# Patient Record
Sex: Female | Born: 1955 | Race: White | Hispanic: No | Smoking: Former smoker
Health system: Southern US, Community
[De-identification: ages and names within clinical notes are randomized; demographics above are authoritative.]

## PROBLEM LIST (undated history)

## (undated) DIAGNOSIS — E785 Hyperlipidemia, unspecified: Secondary | ICD-10-CM

## (undated) DIAGNOSIS — I4891 Unspecified atrial fibrillation: Secondary | ICD-10-CM

## (undated) DIAGNOSIS — M8580 Other specified disorders of bone density and structure, unspecified site: Secondary | ICD-10-CM

## (undated) DIAGNOSIS — K219 Gastro-esophageal reflux disease without esophagitis: Secondary | ICD-10-CM

## (undated) HISTORY — DX: Gastro-esophageal reflux disease without esophagitis: K21.9

## (undated) HISTORY — DX: Hyperlipidemia, unspecified: E78.5

## (undated) HISTORY — DX: Other specified disorders of bone density and structure, unspecified site: M85.80

## (undated) HISTORY — PX: EXPLORATORY LAPAROTOMY: SUR591

## (undated) HISTORY — PX: TONSILLECTOMY AND ADENOIDECTOMY: SUR1326

---

## 2007-03-13 ENCOUNTER — Other Ambulatory Visit: Admission: RE | Admit: 2007-03-13 | Discharge: 2007-03-13 | Payer: Self-pay | Admitting: Family Medicine

## 2007-03-21 ENCOUNTER — Encounter: Admission: RE | Admit: 2007-03-21 | Discharge: 2007-03-21 | Payer: Self-pay | Admitting: Family Medicine

## 2007-11-14 ENCOUNTER — Encounter: Admission: RE | Admit: 2007-11-14 | Discharge: 2007-11-14 | Payer: Self-pay | Admitting: Family Medicine

## 2009-07-18 ENCOUNTER — Other Ambulatory Visit: Admission: RE | Admit: 2009-07-18 | Discharge: 2009-07-18 | Payer: Self-pay | Admitting: Family Medicine

## 2010-07-26 ENCOUNTER — Other Ambulatory Visit: Payer: Self-pay | Admitting: Family Medicine

## 2010-07-26 ENCOUNTER — Other Ambulatory Visit (HOSPITAL_COMMUNITY)
Admission: RE | Admit: 2010-07-26 | Discharge: 2010-07-26 | Disposition: A | Discharge status: Home / Self Care | Payer: Self-pay | Source: Ambulatory Visit | Attending: Family Medicine | Admitting: Family Medicine

## 2010-07-26 DIAGNOSIS — Z124 Encounter for screening for malignant neoplasm of cervix: Secondary | ICD-10-CM | POA: Insufficient documentation

## 2011-08-07 ENCOUNTER — Encounter (HOSPITAL_BASED_OUTPATIENT_CLINIC_OR_DEPARTMENT_OTHER): Admission: RE | Payer: Self-pay | Source: Ambulatory Visit

## 2011-08-07 ENCOUNTER — Ambulatory Visit (HOSPITAL_BASED_OUTPATIENT_CLINIC_OR_DEPARTMENT_OTHER)
Admission: RE | Admit: 2011-08-07 | Payer: BC Managed Care – PPO | Source: Ambulatory Visit | Admitting: Orthopedic Surgery

## 2011-08-07 SURGERY — MINOR RELEASE TRIGGER FINGER/A-1 PULLEY
Anesthesia: LOCAL | Laterality: Left

## 2018-02-11 ENCOUNTER — Encounter: Payer: Self-pay | Admitting: *Deleted

## 2018-02-11 ENCOUNTER — Ambulatory Visit: Payer: BLUE CROSS/BLUE SHIELD | Admitting: Cardiology

## 2018-02-11 VITALS — BP 148/87 | HR 90 | Ht 63.0 in | Wt 154.0 lb

## 2018-02-11 DIAGNOSIS — I493 Ventricular premature depolarization: Secondary | ICD-10-CM

## 2018-02-11 DIAGNOSIS — I519 Heart disease, unspecified: Secondary | ICD-10-CM

## 2018-02-11 DIAGNOSIS — R0789 Other chest pain: Secondary | ICD-10-CM

## 2018-02-11 NOTE — Patient Instructions (Signed)
Your physician recommends that you schedule a follow-up appointment in: [facility]  Your physician recommends that you continue on your current medications as directed. Please refer to the Current Medication list given to you today.  Thank you for choosing [facility]!!

## 2018-02-11 NOTE — Progress Notes (Signed)
Clinical Summary Kimberly Frank is a 62 y.o.female seen as new patient for the following medical problems.   1. Left ventricular systolic dysfunction - clinic notes indicate recent admission for chest pain, found to have LVEF 45%  - admitted prior [place] - while at work. Cramping feeling mid chest/epigastric to between shoulder blades. No other associated symptoms. Checked her pulse and found to be fast, called 911. PVCs noted by EMS - not better with NG - pain lasted several hours, 11AM to 8pm. 5-8/10 in severity. Better with IV pain medicine. Not affected by food.  - "told she had infection in her heart" - no recurrent chest pain since hospital.   - fatigue on beta blocker initially, beginning to tolerate  - CAD risk factors: remote x 11 years, reports had MI due to congenital heart disease, father MI 90s  - home bps high 90s to 110s/60 to 70s   2. PVCs - infrequent, rare palpitations - 2 cups of coffee in AM, no other caffeine. Has quit now.     Addendum Records made available after initial appt. Discharge summary lacks any details.  01/2018 echo LVEF 45%, moderate MR    SH: works as professor in business  Past Medical History:  Diagnosis Date  . GERD (gastroesophageal reflux disease)   . Hyperlipidemia   . Osteopenia      Not on File   Current Outpatient Medications  Medication Sig Dispense Refill  . Calcium Carbonate-Vit D-Min (CALCIUM 1200 PO) Take by mouth.    . cholecalciferol (VITAMIN D) 1000 UNITS tablet Take 1,000 Units by mouth daily.    . diazepam (VALIUM) 5 MG tablet Take 5 mg by mouth every 6 (six) hours as needed for anxiety.    . pantoprazole (PROTONIX) 40 MG tablet Take 40 mg by mouth daily.     No current facility-administered medications for this visit.      Past Surgical History:  Procedure Laterality Date  . EXPLORATORY LAPAROTOMY    . TONSILLECTOMY AND ADENOIDECTOMY       Not on File    Family History  Problem  Relation Age of Onset  . Heart attack Mother   . Heart disease Father   . Hypertension Brother      Social History Kimberly Frank reports that she has quit smoking. She quit smokeless tobacco use about 25 years ago. Kimberly Frank has no alcohol history on file.   Review of Systems CONSTITUTIONAL: No weight loss, fever, chills, weakness or fatigue.  HEENT: Eyes: No visual loss, blurred vision, double vision or yellow sclerae.No hearing loss, sneezing, congestion, runny nose or sore throat.  SKIN: No rash or itching.  CARDIOVASCULAR: per hpi RESPIRATORY: No shortness of breath, cough or sputum.  GASTROINTESTINAL: No anorexia, nausea, vomiting or diarrhea. No abdominal pain or blood.  GENITOURINARY: No burning on urination, no polyuria NEUROLOGICAL: No headache, dizziness, syncope, paralysis, ataxia, numbness or tingling in the extremities. No change in bowel or bladder control.  MUSCULOSKELETAL: No muscle, back pain, joint pain or stiffness.  LYMPHATICS: No enlarged nodes. No history of splenectomy.  PSYCHIATRIC: No history of depression or anxiety.  ENDOCRINOLOGIC: No reports of sweating, cold or heat intolerance. No polyuria or polydipsia.  Marland Kitchen.   Physical Examination Vitals:   02/11/18 1248 02/11/18 1258  BP: (!) 167/79 (!) 148/87  Pulse: 61 90  SpO2: 98% 98%   Vitals:   02/11/18 1248  Weight: 154 lb (69.9 kg)  Height: 5\' 3"  (1.6 m)  Gen: resting comfortably, no acute distress HEENT: no scleral icterus, pupils equal round and reactive, no palptable cervical adenopathy,  CV: RRR, no m/r/g, no jvd Resp: Clear to auscultation bilaterally GI: abdomen is soft, non-tender, non-distended, normal bowel sounds, no hepatosplenomegaly MSK: extremities are warm, no edema.  Skin: warm, no rash Neuro:  no focal deficits Psych: appropriate affect    Assessment and Plan  1. Left ventricular systolic dysfunction/Chest pain - reported LVEF 45% during recent admission. Admitted with  atypical chest pain - noted to have frequent PVCs during that admission - given her chest pain, mildly decreased LVEF, and PVCs warrants ischemic evaluation. We will plan for exercise nuclear stress test - if no evidence of ischemia, continue to titrate CHF meds for her mild LV dysfunction and would repeat echo in a few months  2. PVCs - asymptomatic - mild LV systolic dysfunction by echo. Awaiting stress results - may plan for 24 hr holter to quantify burden pending stress results   F/u pending stress results      Antoine Poche, M.D.

## 2018-02-12 ENCOUNTER — Encounter: Payer: Self-pay | Admitting: *Deleted

## 2018-02-12 ENCOUNTER — Encounter: Payer: Self-pay | Admitting: Cardiology

## 2018-02-12 ENCOUNTER — Telehealth: Payer: Self-pay | Admitting: *Deleted

## 2018-02-12 DIAGNOSIS — R0789 Other chest pain: Secondary | ICD-10-CM

## 2018-02-12 NOTE — Telephone Encounter (Signed)
-----   Message from Antoine PocheJonathan F Branch, MD sent at 02/12/2018  2:25 PM EST ----- Can we let this patient know we received her hospital records since her visit yesterday and Id like to get an exercise myoview for her for chest pain, hold toprol day of test   Kimberly FerryJ Branch MD

## 2018-02-12 NOTE — Telephone Encounter (Signed)
Patient informed and verbalized understanding of plan. Instruction sheet read to patient and a copy mailed to her home address.

## 2018-02-13 ENCOUNTER — Telehealth: Payer: Self-pay | Admitting: Cardiology

## 2018-02-13 NOTE — Telephone Encounter (Signed)
°  Precert needed for: Exercise Myoview   [facility]    Date: Feb 19, 2018

## 2018-02-14 ENCOUNTER — Telehealth: Payer: Self-pay | Admitting: Cardiology

## 2018-02-14 NOTE — Telephone Encounter (Signed)
Patient accidentally didn't take any medication yesterday (forgot too)  And she said she felt so much better.  Headache went away, kidneys did better, slept better and no night sweats.  She would like to come off the medication. She promises if anything changes with BP or other symptoms she will call us.

## 2018-02-14 NOTE — Telephone Encounter (Signed)
Her stress test is scheduled for 02/19/18.  Will forward to provider for his thoughts on this.

## 2018-02-18 NOTE — Telephone Encounter (Signed)
Ok to hold meds for now, we will need to discuss further at our f/u. She still needs her stress test.    J Rhian Funari MD

## 2018-02-19 ENCOUNTER — Encounter (HOSPITAL_COMMUNITY): Payer: Self-pay

## 2018-02-19 ENCOUNTER — Encounter (HOSPITAL_BASED_OUTPATIENT_CLINIC_OR_DEPARTMENT_OTHER)
Admission: RE | Admit: 2018-02-19 | Discharge: 2018-02-19 | Disposition: A | Discharge status: Home / Self Care | Payer: BLUE CROSS/BLUE SHIELD | Source: Ambulatory Visit | Attending: Cardiology | Admitting: Cardiology

## 2018-02-19 ENCOUNTER — Encounter (HOSPITAL_COMMUNITY)
Admission: RE | Admit: 2018-02-19 | Discharge: 2018-02-19 | Disposition: A | Discharge status: Home / Self Care | Payer: BLUE CROSS/BLUE SHIELD | Source: Ambulatory Visit | Attending: Cardiology | Admitting: Cardiology

## 2018-02-19 DIAGNOSIS — R0789 Other chest pain: Secondary | ICD-10-CM | POA: Diagnosis not present

## 2018-02-19 LAB — NM MYOCAR MULTI W/SPECT W/WALL MOTION / EF
CHL CUP NUCLEAR SDS: 0
CSEPEW: 7 METS
CSEPPHR: 142 {beats}/min
Exercise duration (min): 5 min
Exercise duration (sec): 51 s
LHR: 0.38
LV dias vol: 48 mL (ref 46–106)
LVSYSVOL: 28 mL
MPHR: 156 {beats}/min
NUC STRESS TID: 0.9
Peak BP: 156 mmHg
Percent HR: 91 %
RPE: 11
Rest HR: 93 {beats}/min
SRS: 1
SSS: 1

## 2018-02-19 MED ORDER — TECHNETIUM TC 99M TETROFOSMIN IV KIT
30.0000 | PACK | Freq: Once | INTRAVENOUS | Status: AC | PRN
  Administered 2018-02-19: 28.8 via INTRAVENOUS

## 2018-02-19 MED ORDER — SODIUM CHLORIDE 0.9% FLUSH
INTRAVENOUS | Status: AC
  Administered 2018-02-19: 10 mL via INTRAVENOUS
  Filled 2018-02-19: qty 10

## 2018-02-19 MED ORDER — TECHNETIUM TC 99M TETROFOSMIN IV KIT
10.0000 | PACK | Freq: Once | INTRAVENOUS | Status: AC | PRN
  Administered 2018-02-19: 9.8 via INTRAVENOUS

## 2018-02-19 MED ORDER — REGADENOSON 0.4 MG/5ML IV SOLN
INTRAVENOUS | Status: AC
  Filled 2018-02-19: qty 5

## 2018-02-19 NOTE — Telephone Encounter (Signed)
Patient notified and verbalized understanding.  Stated since stopping those medications she feels great.

## 2018-02-20 ENCOUNTER — Telehealth: Payer: Self-pay | Admitting: *Deleted

## 2018-02-20 DIAGNOSIS — I493 Ventricular premature depolarization: Secondary | ICD-10-CM

## 2018-02-20 NOTE — Telephone Encounter (Signed)
-----   Message from Antoine PocheJonathan F Branch, MD sent at 02/20/2018  2:29 PM EST ----- Stress test looks good, no evidence of any blockages. Can we obtain a 24 hr holter monitor for PVCs. We need to measure exactly how often she is having these abnormal heart beats   Dominga FerryJ Branch MD

## 2018-02-20 NOTE — Telephone Encounter (Signed)
LM to return call.

## 2018-02-21 NOTE — Telephone Encounter (Signed)
-----   Message from Jonathan F Branch, MD sent at 02/20/2018  2:29 PM EST ----- Stress test looks good, no evidence of any blockages. Can we obtain a 24 hr holter monitor for PVCs. We need to measure exactly how often she is having these abnormal heart beats   J Branch MD 

## 2018-02-21 NOTE — Telephone Encounter (Signed)
Patient informed and verbalized understanding of plan. 

## 2018-03-03 ENCOUNTER — Ambulatory Visit (INDEPENDENT_AMBULATORY_CARE_PROVIDER_SITE_OTHER): Payer: BLUE CROSS/BLUE SHIELD

## 2018-03-03 DIAGNOSIS — I493 Ventricular premature depolarization: Secondary | ICD-10-CM | POA: Diagnosis not present

## 2018-03-17 ENCOUNTER — Telehealth: Payer: Self-pay | Admitting: Cardiology

## 2018-03-17 NOTE — Telephone Encounter (Signed)
Calling for results of Monitor  can leave results on VM at home

## 2018-03-17 NOTE — Telephone Encounter (Signed)
Noted- not available at this time

## 2018-03-20 ENCOUNTER — Telehealth: Payer: Self-pay | Admitting: *Deleted

## 2018-03-20 DIAGNOSIS — I493 Ventricular premature depolarization: Secondary | ICD-10-CM

## 2018-03-20 DIAGNOSIS — I519 Heart disease, unspecified: Secondary | ICD-10-CM

## 2018-03-20 NOTE — Telephone Encounter (Signed)
-----   Message from Antoine Poche, MD sent at 03/20/2018  4:19 PM EST ----- Sorry for delay, I read the monitor as soon as the company sent it to [place]. She has fairly frequent extra heart beats coming from the bottom of her heart that could be contributing to the mild weakness of her heart function. Can we refer her to EP for PVCs and systolic dysfunction   Dominga Ferry MD

## 2018-03-20 NOTE — Telephone Encounter (Signed)
Pt voiced understanding - says she has had 2 episodes of "aching/fluttering" since 03/04/18 lasting around 15-20 mins each time - was concerned if she needed anything in the meantime while awaiting an appt with EP - referral sent to schedulers - routed to pcp

## 2018-03-20 NOTE — Telephone Encounter (Signed)
Pt upset that monitor results have not been given to her - apologized for the delay and that we have been checking on status (was not available yesterday on site) uploaded today and would forward to Dr Wyline Mood

## 2018-03-21 NOTE — Telephone Encounter (Signed)
Her toprol can help, she is on a low dose, lets increase to 50mg  daily. Should also see me back in 2 months   Dominga Ferry MD

## 2018-03-21 NOTE — Telephone Encounter (Signed)
Pt says she has not been taking any medication since before stress test - says she was told she didn't have to take any medications from our office however don't see any notes indicating this - should she start taking Toprol XL 25 mg daily again ?

## 2018-03-24 NOTE — Telephone Encounter (Signed)
That is correct based on the phone notes, she was having some side effects and stopped taking. I would like to retry her on a lower dose of what she was on before Toprol, this is a good medicine to help prevent extra heart beats and also strengthen the heart. Can she try Toprol 12.5mg  daily, update [place] with any side effects   Dominga Ferry MD

## 2018-03-24 NOTE — Telephone Encounter (Signed)
Pt voiced understanding - updated medication list 

## 2018-04-03 ENCOUNTER — Other Ambulatory Visit (HOSPITAL_COMMUNITY): Payer: Self-pay | Admitting: Internal Medicine

## 2018-04-03 ENCOUNTER — Other Ambulatory Visit: Payer: Self-pay | Admitting: Internal Medicine

## 2018-04-03 DIAGNOSIS — K759 Inflammatory liver disease, unspecified: Secondary | ICD-10-CM

## 2018-04-03 DIAGNOSIS — R1010 Upper abdominal pain, unspecified: Secondary | ICD-10-CM

## 2018-04-07 ENCOUNTER — Telehealth: Payer: Self-pay | Admitting: Cardiology

## 2018-04-07 ENCOUNTER — Telehealth: Payer: Self-pay | Admitting: *Deleted

## 2018-04-07 NOTE — Telephone Encounter (Signed)
Patient called stating that she went to her [facility] Last week and was told that she is allergy to metoprolol succinate (TOPROL-XL) 25 MG   (can leave a message)

## 2018-04-07 NOTE — Telephone Encounter (Signed)
-----   Message from Antoine Poche, MD sent at 04/07/2018  2:35 PM EST ----- EP is going to see her this month, can I see her in 3 months   J BrancH MD ----- Message ----- From: Albertine Patricia, CMA Sent: 03/27/2018  11:24 AM EST To: Antoine Poche, MD  Does this pt need f/u appt ?  Kimberly Frank

## 2018-04-07 NOTE — Telephone Encounter (Signed)
Scheduled f/u.

## 2018-04-07 NOTE — Telephone Encounter (Signed)
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

## 2018-04-07 NOTE — Telephone Encounter (Signed)
Can we clarify what the allergy is her pcp has linked to Toprol and add it to her chart here. Would hold on any new meds until this liver issue is better understood to not cloud the picture with any new side effects, she has EP appt in 2 weeks. Do they know what caused the issue with her liver?   Dominga Ferry MD

## 2018-04-07 NOTE — Telephone Encounter (Signed)
Pt stopped Toprol XL (notes in [facility] [place]) liver test was abnormal and pt is having [place] this week with repeat lab work - will forward to providers FYI - pt has appt with Dr Johney Frame 2/19

## 2018-04-10 ENCOUNTER — Ambulatory Visit (HOSPITAL_COMMUNITY): Payer: BLUE CROSS/BLUE SHIELD

## 2018-04-10 ENCOUNTER — Ambulatory Visit (HOSPITAL_COMMUNITY)
Admission: RE | Admit: 2018-04-10 | Discharge: 2018-04-10 | Disposition: A | Discharge status: Home / Self Care | Payer: BLUE CROSS/BLUE SHIELD | Source: Ambulatory Visit | Attending: Internal Medicine | Admitting: Internal Medicine

## 2018-04-10 DIAGNOSIS — R1010 Upper abdominal pain, unspecified: Secondary | ICD-10-CM | POA: Diagnosis present

## 2018-04-10 DIAGNOSIS — K759 Inflammatory liver disease, unspecified: Secondary | ICD-10-CM | POA: Insufficient documentation

## 2018-04-11 ENCOUNTER — Other Ambulatory Visit (HOSPITAL_COMMUNITY): Payer: BLUE CROSS/BLUE SHIELD

## 2018-04-23 ENCOUNTER — Encounter: Payer: Self-pay | Admitting: Internal Medicine

## 2018-04-23 ENCOUNTER — Ambulatory Visit: Payer: BLUE CROSS/BLUE SHIELD | Admitting: Internal Medicine

## 2018-04-23 ENCOUNTER — Encounter (INDEPENDENT_AMBULATORY_CARE_PROVIDER_SITE_OTHER): Payer: Self-pay

## 2018-04-23 VITALS — BP 130/80 | HR 101 | Ht 63.0 in | Wt 149.4 lb

## 2018-04-23 DIAGNOSIS — I493 Ventricular premature depolarization: Secondary | ICD-10-CM

## 2018-04-23 NOTE — Progress Notes (Signed)
Electrophysiology Office Note   Date:  04/23/2018   ID:  Kimberly, Frank Dec 29, 1955, MRN [id number]  PCP:  Homero Fellers, MD  Cardiologist:  Dr Wyline Mood Primary Electrophysiologist: Hillis Range, MD    CC: PVCs   History of Present Illness: Kimberly Frank is a 63 y.o. female who presents today for electrophysiology evaluation.   She is referred by Dr Wyline Mood for EP consultation regarding PVCs.  She reports having atypical chest pain in November.  She was evaluated and noted to have PVCs a that time.  She continues to have subsequent PVCs.  She has had some stress related to moving in a retirement community over the past few months. She has rare palpitations with brief SOB but is mostly tolerant of her PVCs.  She tried metoprolol but feels that this caused liver failure.  She is reluctant to try other medicines.  Today, she denies symptoms of orthopnea, PND, lower extremity edema, claudication, dizziness, presyncope, syncope, bleeding, or neurologic sequela. The patient is tolerating medications without difficulties and is otherwise without complaint today.    Past Medical History:  Diagnosis Date  . GERD (gastroesophageal reflux disease)   . Hyperlipidemia   . Osteopenia    Past Surgical History:  Procedure Laterality Date  . EXPLORATORY LAPAROTOMY    . TONSILLECTOMY AND ADENOIDECTOMY       No current outpatient medications on file.   No current facility-administered medications for this visit.     Allergies:   Azithromycin; Carvedilol; Metoprolol; and Sulfa antibiotics   Social History:  The patient  reports that she has quit smoking. She quit smokeless tobacco use about 26 years ago.   Family History:  The patient's  family history includes Heart attack in her mother; Heart disease in her father; Hypertension in her brother.    ROS:  Please see the history of present illness.   All other systems are personally reviewed and negative.    PHYSICAL EXAM: VS:  BP  130/80   Pulse (!) 101   Ht 5\' 3"  (1.6 m)   Wt 149 lb 6.4 oz (67.8 kg)   SpO2 99%   BMI 26.47 kg/m  , BMI Body mass index is 26.47 kg/m. GEN: Well nourished, well developed, in no acute distress  HEENT: normal  Neck: no JVD, carotid bruits, or masses Cardiac: RRR; no murmurs, rubs, or gallops,no edema  Respiratory:  clear to auscultation bilaterally, normal work of breathing GI: soft, nontender, nondistended, + BS MS: no deformity or atrophy  Skin: warm and dry  Neuro:  Strength and sensation are intact Psych: euthymic mood, full affect  EKG:  EKG is ordered today. The ekg ordered today is personally reviewed and shows sinus rhythm, LAD, rare PVCS (LBB L inferior axis with precordial transition at V2.  narow complex PVCs which are negative in III also   Recent Labs: No results found for requested labs within last 8760 hours.  personally reviewed   Lipid Panel  No results found for: CHOL, TRIG, HDL, CHOLHDL, VLDL, LDLCALC, LDLDIRECT personally reviewed   Wt Readings from Last 3 Encounters:  04/23/18 149 lb 6.4 oz (67.8 kg)  02/11/18 154 lb (69.9 kg)      Other studies personally reviewed: Additional studies/ records that were reviewed today include: Dr Princess Perna notes, prior echo--> EF 45%, moderate MR, normal RV size/ function  Review of the above records today demonstrates: holter 03/20/2018-  PVC burden 25%   ASSESSMENT AND PLAN:  1.  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  Lifestyle modification including yoga, regular exercise, caffeine avoidance, and stress management were discussed. She is clear that she does not wish to pursue additional medicines or ablation at this time.   Follow-up:  I will see again in the [place] office in 6 months  Current medicines are reviewed at length with the patient today.   The patient does not have concerns regarding her medicines.  The following changes were made today:   none   Signed, Hillis Range, MD  04/23/2018 11:47 AM     [facility] [address] [place] [address] [phone] (office) [phone] (fax)

## 2018-04-23 NOTE — Patient Instructions (Addendum)
Medication Instructions:  Your physician recommends that you continue on your current medications as directed. Please refer to the Current Medication list given to you today.  * If you need a refill on your cardiac medications before your next appointment, please call your pharmacy.   Labwork: None ordered  Testing/Procedures: None ordered  Follow-Up: Your physician wants you to follow-up in: 6 months with Dr. Allred.  You will receive a reminder letter in the mail two months in advance. If you don't receive a letter, please call our office to schedule the follow-up appointment.   Thank you for choosing [facility]!!         

## 2018-04-25 NOTE — Addendum Note (Signed)
Addended by: Solon Augusta on: 04/25/2018 09:13 AM   Modules accepted: Orders

## 2018-07-16 ENCOUNTER — Telehealth: Payer: Self-pay | Admitting: Cardiology

## 2018-07-16 NOTE — Telephone Encounter (Signed)
I doubt her symptoms in December were related to COVID, if by chance they were the infection would be cleared now so long after, would not be any benefit for testing. Ok to cancel our appt, f/u with Dr Johney Frame in August. Can call us if issues before.    Dominga Ferry MD

## 2018-07-16 NOTE — Telephone Encounter (Signed)
I called patient to confirm appointment - she stated that she should not have an appointment due to Dr Johney Frame talking her off of all her medications back in Feb 2020.  She asked that I have some one contact her about getting tested for COVID 19 that she believes that is what all her problems maybe from I cancelled her appointment per her request with Dr Wyline Mood on Jul 17, 2018

## 2018-07-16 NOTE — Telephone Encounter (Signed)
Pt was scheduled to see Dr Wyline Mood tomorrow for 3 month f/u. Says she doesn't see a need to be seen at this time - denies any symptoms (has occasional palpitations every few weeks that are not bothersome) has f/u with Dr Johney Frame in August. Pt wanted to ask Dr Wyline Mood if he thinks that her symptoms from December 2019 (chest pain/red eyes/SOB/weakness) could have been related to COVID 19 and if he thinks she should contact her pcp to schedule an antibody test.

## 2018-07-16 NOTE — Telephone Encounter (Signed)
Pt voiced understanding and appreciative  

## 2018-07-17 ENCOUNTER — Ambulatory Visit: Payer: BLUE CROSS/BLUE SHIELD | Admitting: Cardiology

## 2018-10-10 ENCOUNTER — Ambulatory Visit: Payer: BLUE CROSS/BLUE SHIELD | Admitting: Internal Medicine

## 2018-10-28 ENCOUNTER — Other Ambulatory Visit: Payer: Self-pay

## 2018-10-28 ENCOUNTER — Emergency Department (HOSPITAL_COMMUNITY)
Admission: EM | Admit: 2018-10-28 | Discharge: 2018-10-28 | Disposition: A | Discharge status: Home / Self Care | Payer: BC Managed Care – PPO | Attending: Emergency Medicine | Admitting: Emergency Medicine

## 2018-10-28 ENCOUNTER — Encounter (HOSPITAL_COMMUNITY): Payer: Self-pay | Admitting: Emergency Medicine

## 2018-10-28 DIAGNOSIS — L509 Urticaria, unspecified: Secondary | ICD-10-CM | POA: Diagnosis not present

## 2018-10-28 DIAGNOSIS — R21 Rash and other nonspecific skin eruption: Secondary | ICD-10-CM | POA: Diagnosis present

## 2018-10-28 DIAGNOSIS — Z87891 Personal history of nicotine dependence: Secondary | ICD-10-CM | POA: Insufficient documentation

## 2018-10-28 HISTORY — DX: Unspecified atrial fibrillation: I48.91

## 2018-10-28 LAB — BASIC METABOLIC PANEL
Anion gap: 9 (ref 5–15)
BUN: 18 mg/dL (ref 8–23)
CO2: 24 mmol/L (ref 22–32)
Calcium: 8.9 mg/dL (ref 8.9–10.3)
Chloride: 107 mmol/L (ref 98–111)
Creatinine, Ser: 0.76 mg/dL (ref 0.44–1.00)
GFR calc Af Amer: >60 mL/min (ref >60)
GFR calc non Af Amer: >60 mL/min (ref >60)
Glucose, Bld: 126 mg/dL — ABNORMAL HIGH (ref 70–99)
Potassium: 4.6 mmol/L (ref 3.5–5.1)
Sodium: 140 mmol/L (ref 135–145)

## 2018-10-28 LAB — URINALYSIS, ROUTINE W REFLEX MICROSCOPIC
Bilirubin Urine: NEGATIVE
Glucose, UA: NEGATIVE mg/dL
Hgb urine dipstick: NEGATIVE
Ketones, ur: 5 mg/dL — AB
Leukocytes,Ua: NEGATIVE
Nitrite: NEGATIVE
Protein, ur: NEGATIVE mg/dL
Specific Gravity, Urine: 1.004 — ABNORMAL LOW (ref 1.005–1.030)
pH: 6 (ref 5.0–8.0)

## 2018-10-28 LAB — CBC WITH DIFFERENTIAL/PLATELET
Abs Immature Granulocytes: 0.04 10*3/uL (ref 0.00–0.07)
Basophils Absolute: 0 10*3/uL (ref 0.0–0.1)
Basophils Relative: 0 %
Eosinophils Absolute: 0 10*3/uL (ref 0.0–0.5)
Eosinophils Relative: 0 %
HCT: 40.6 % (ref 36.0–46.0)
Hemoglobin: 13.1 g/dL (ref 12.0–15.0)
Immature Granulocytes: 0 %
Lymphocytes Relative: 12 %
Lymphs Abs: 1.1 10*3/uL (ref 0.7–4.0)
MCH: 29.4 pg (ref 26.0–34.0)
MCHC: 32.3 g/dL (ref 30.0–36.0)
MCV: 91 fL (ref 80.0–100.0)
Monocytes Absolute: 0.3 10*3/uL (ref 0.1–1.0)
Monocytes Relative: 4 %
Neutro Abs: 7.7 10*3/uL (ref 1.7–7.7)
Neutrophils Relative %: 84 %
Platelets: 402 10*3/uL — ABNORMAL HIGH (ref 150–400)
RBC: 4.46 MIL/uL (ref 3.87–5.11)
RDW: 13 % (ref 11.5–15.5)
WBC: 9.1 10*3/uL (ref 4.0–10.5)
nRBC: 0 % (ref 0.0–0.2)

## 2018-10-28 NOTE — Discharge Instructions (Addendum)
Your lab tests tonight are normal and have been printed for you for your records.  Keep your appointment tomorrow as planned and start taking the vistaril and the new steroid prescribed by your doctor today.  You may also add Advil as discussed which can help with inflammation, but discontinue this if you develop any stomach upset.

## 2018-10-28 NOTE — ED Triage Notes (Signed)
Pt states Friday, her left hand was swollen, red, warm to touch.  Was seen had given steroids and benadryl.    Pt now has blurred vision, rash and swelling to left arm and right hip.

## 2018-10-29 NOTE — ED Provider Notes (Addendum)
[facility] EMERGENCY DEPARTMENT Provider Note   CSN: [id number] Arrival date & time: 10/28/18  1656     History   Chief Complaint Chief Complaint  Patient presents with  . Rash    HPI Kimberly Frank is a 63 y.o. female.with a history of atrial fibrillation, GERD, Hyperlipidemia and osteopenia presenting with a persistent hive like rash which started 4 mornings ago, initially with left hand swelling with redness and itching upon waking.  She was seen at an outside emergency dept and was treated with IV steroids and will complete a course of prednisone 60 mg daily today.  She returned to the ED that same day, later in the evening as the rash had migrated to her trunk and other extremities in patchy raised distribution and also had lip swelling after which the initial areas faded.  She was given additional medications and the rest of the weekend continued to have waxing waning migratory sx c/w hives.  No sob, no mouth throat or tongue swelling, no wheezing currently. She reports waking with cloudy vision this morning but has now resolved.  Is also taking benadryl and pepcid.  Seen by pcp today who encouraged ed eval due to tachycardia in her office.  She was prescribed dexamethasone 16 mg qod x 5 days which she has been instructed to started tomorrow. PCP also arranged an allergist evaluation for tomorrow in [place].    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 She questions possible reaction to this.     The history is provided by the patient.    Past Medical History:  Diagnosis Date  . Atrial fibrillation ([place])   . GERD (gastroesophageal reflux disease)   . Hyperlipidemia   . Osteopenia     There are no active problems to display for this patient.   Past Surgical History:  Procedure Laterality Date  . EXPLORATORY LAPAROTOMY    . TONSILLECTOMY AND ADENOIDECTOMY        OB History   No obstetric history on file.      Home Medications    Prior to Admission medications   Not on File    Family History Family History  Problem Relation Age of Onset  . Heart attack Mother   . Heart disease Father   . Hypertension Brother     Social History Social History   Tobacco Use  . Smoking status: Former Research scientist (life sciences)  . Smokeless tobacco: Former Systems developer    Quit date: 03/05/1992  Substance Use Topics  . Alcohol use: Not on file  . Drug use: Not on file     Allergies   Azithromycin, Carvedilol, Metoprolol, and Sulfa antibiotics   Review of Systems Review of Systems  Constitutional: Negative for chills and fever.  HENT: Negative.  Negative for trouble swallowing and voice change.   Respiratory: Negative for cough, choking, chest tightness, shortness of breath, wheezing and stridor.   Gastrointestinal: Negative.  Negative for nausea and vomiting.  Skin: Positive for rash.  Neurological: Negative for numbness.     Physical Exam Updated Vital Signs BP (!) 142/82 (BP Location: Right Arm)   Pulse 90   Temp 98 F (36.7 C) (Oral)   Resp 18   Ht 5\' 3"  (1.6 m)   Wt 71.2 kg   SpO2 98%   BMI 27.81 kg/m   Physical Exam Vitals signs and nursing note reviewed.  Constitutional:      General: She is not in acute distress.    Appearance: She is well-developed.     Comments: anxious  HENT:     Head: Normocephalic and atraumatic.     Nose: No congestion.     Mouth/Throat:     Mouth: Mucous membranes are moist. No angioedema.     Pharynx: Oropharynx is clear. Uvula midline. No pharyngeal swelling.  Eyes:     Extraocular Movements: Extraocular movements intact.     Conjunctiva/sclera: Conjunctivae normal.  Neck:     Musculoskeletal: Normal range of motion.  Cardiovascular:     Rate and Rhythm: Normal rate and regular rhythm.     Pulses: Normal pulses.     Heart sounds: Normal heart sounds.  Pulmonary:     Effort: Pulmonary effort is normal.      Breath sounds: Normal breath sounds. No stridor. No wheezing or rhonchi.  Abdominal:     General: Bowel sounds are normal.     Palpations: Abdomen is soft.     Tenderness: There is no abdominal tenderness.  Musculoskeletal: Normal range of motion.  Skin:    General: Skin is warm and dry.     Capillary Refill: Capillary refill takes less than 2 seconds.     Findings: Rash present. Rash is urticarial.     Comments: Scattered areas of raised urticarial like lesions, largest on left antecubital and left medial thigh with dark markings c/w scratch abrasions.  Smaller hives on bilateral arms, abd, buttocks, knees and distal left index finger.  During ed stay also began developing a new wheal on left plantar foot and her left index finger involvement was resolving.  Neurological:     Mental Status: She is alert.      ED Treatments / Results  Labs (all labs ordered are listed, but only abnormal results are displayed) Labs Reviewed  CBC WITH DIFFERENTIAL/PLATELET - Abnormal; Notable for the following components:      Result Value   Platelets 402 (*)    All other components within normal limits  BASIC METABOLIC PANEL - Abnormal; Notable for the following components:   Glucose, Bld 126 (*)    All other components within normal limits  URINALYSIS, ROUTINE W REFLEX MICROSCOPIC - Abnormal; Notable for the following components:   Color, Urine STRAW (*)    Specific Gravity, Urine 1.004 (*)    Ketones, ur 5 (*)    All other components within normal limits    EKG EKG Interpretation  Date/Time:  Tuesday October 28 2018 17:27:37 EDT Ventricular Rate:  105 PR Interval:    QRS Duration: 87 QT Interval:  345 QTC Calculation: 456 R Axis:   12 Text Interpretation:  Sinus tachycardia Ventricular trigeminy Consider left atrial enlargement Confirmed by Donnetta Hutching ([id number]) on 10/28/2018 6:10:32 PM   Radiology No results found.  Procedures Procedures (including critical care time)   Medications Ordered in ED Medications - No data to display   Initial Impression / Assessment and Plan / ED Course  I have reviewed the triage vital signs and the nursing notes.  Pertinent labs & imaging results that were available during my care of the patient were reviewed by me and considered in my medical decision making (see chart for details).       Basic labs obtained and reviewed with pt.  She has no respiratory compromise, no sx of angioedema, no suggestion of anaphylactic reaction.  She may need further allergy/vascular specific labs but will  defer to allergist she is scheduled to see tomorrow.  She was encouraged to continue meds per her MD's recommendations.  Offered atarax in place of benadryl. PCP has just prescribed this today but has not started. Pt opted to take once home.  The patient appears reasonably screened and/or stabilized for discharge and I doubt any other medical condition or other [facility] requiring further screening, evaluation, or treatment in the ED at this time prior to discharge.  Pt was seen by Dr. Adriana Simasook during todays visit.  Additional information obtained from outside records. Pt also has a history of eosinophilic esophagitis.  She has an epipen for home use if needed.   Final Clinical Impressions(s) / ED Diagnoses   Final diagnoses:  Urticaria    ED Discharge Orders    None       Victoriano Laindol, Hyland Mollenkopf, PA-C 10/29/18 1641    Burgess AmorIdol, Hanish Laraia, PA-C 10/29/18 1647    Donnetta Hutchingook, Brian, MD 10/29/18 1655

## 2018-11-05 ENCOUNTER — Telehealth: Payer: Self-pay | Admitting: Internal Medicine

## 2018-11-05 NOTE — Telephone Encounter (Signed)
Wants to see if she needs to be seen in office  Allergist told her she should be seen due to recent allergic reactions and she has passed out.  Been having some issues

## 2018-11-07 ENCOUNTER — Ambulatory Visit: Payer: BC Managed Care – PPO | Admitting: Internal Medicine

## 2018-11-07 ENCOUNTER — Encounter: Payer: Self-pay | Admitting: Internal Medicine

## 2018-11-07 ENCOUNTER — Ambulatory Visit: Payer: BLUE CROSS/BLUE SHIELD | Admitting: Internal Medicine

## 2018-11-07 ENCOUNTER — Other Ambulatory Visit: Payer: Self-pay

## 2018-11-07 VITALS — BP 134/84 | HR 95 | Ht 63.5 in | Wt 149.0 lb

## 2018-11-07 DIAGNOSIS — R55 Syncope and collapse: Secondary | ICD-10-CM | POA: Diagnosis not present

## 2018-11-07 DIAGNOSIS — I493 Ventricular premature depolarization: Secondary | ICD-10-CM

## 2018-11-07 DIAGNOSIS — Z91018 Allergy to other foods: Secondary | ICD-10-CM

## 2018-11-07 NOTE — Patient Instructions (Signed)
Medication Instructions:  Continue all current medications.  Labwork: none  Testing/Procedures: none  Follow-Up: 2 months - Dr. Rayann Heman  Any Other Special Instructions Will Be Listed Below (If Applicable).  If you need a refill on your cardiac medications before your next appointment, please call your pharmacy.

## 2018-11-07 NOTE — Progress Notes (Signed)
   PCP: Minna Antis, DO Primary Cardiologist: Dr Harl Bowie Primary EP: Dr Rayann Heman  CC: allergic reaction  Kimberly Frank is a 63 y.o. female who presents today for routine electrophysiology followup.  Since last being seen in our clinic, the patient reports doing reasonably well.  She has developed an allergic syndrome.  She has tested positive for alpha gal and this is likely the cause.  She has had numerous visits to the ED for urticaria and even angioedema.  She thinks that she may have had anaphylaxis and now carries an epi pen.  She is being evaluated by an allergy specialist.  She had syncope in the setting of marked allergic reaction which is likely hypotensive in origin. She has rare palpitations. Today, she denies symptoms of chest pain, shortness of breath,  lower extremity edema, dizziness, or neuro changes.  The patient is otherwise without complaint today.   Past Medical History:  Diagnosis Date  . Atrial fibrillation ([place])   . GERD (gastroesophageal reflux disease)   . Hyperlipidemia   . Osteopenia    Past Surgical History:  Procedure Laterality Date  . EXPLORATORY LAPAROTOMY    . TONSILLECTOMY AND ADENOIDECTOMY      ROS- all systems are reviewed and negatives except as per HPI above  Current Outpatient Medications  Medication Sig Dispense Refill  . diphenhydrAMINE (BENADRYL) 2 % cream Apply topically 3 (three) times daily as needed for itching.    Marland Kitchen Fexofenadine HCl (ALLEGRA PO) Take 10 mg by mouth 2 (two) times daily.     No current facility-administered medications for this visit.     Physical Exam: Vitals:   11/07/18 1054  BP: 134/84  Pulse: 95  SpO2: 98%  Weight: 149 lb (67.6 kg)  Height: 5' 3.5" (1.613 m)    GEN- The patient is well appearing, alert and oriented x 3 today.   Head- normocephalic, atraumatic Eyes-  Sclera clear, conjunctiva pink Ears- hearing intact Oropharynx- clear Lungs- Clear to ausculation bilaterally, normal work of breathing  Heart- Regular rate and rhythm, no murmurs, rubs or gallops, PMI not laterally displaced GI- soft, NT, ND, + BS Extremities- no clubbing, cyanosis, or edema  Wt Readings from Last 3 Encounters:  11/07/18 149 lb (67.6 kg)  10/28/18 157 lb (71.2 kg)  04/23/18 149 lb 6.4 oz (67.8 kg)    EKG tracing 10/29/2018 is reviewed  Assessment and Plan:  1. 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  I am reluctant to consider other medines  2. Syncope Occurred in the setting of marked allergic state and is likely due to low BP Consider additional workup if this continues to happen after her urticaria/ anaphylaxis are resolved  3. Allergy syndrome Alpha gal positive Followed by an allergy specialist  Return to see me in 2 months  Thompson Grayer MD, [facility] 11/07/2018 11:14 AM

## 2019-01-09 ENCOUNTER — Encounter: Payer: BC Managed Care – PPO | Admitting: Internal Medicine

## 2019-05-04 IMAGING — US ULTRASOUND ABDOMEN LIMITED
1 series · 14 of 25 positions shown · non-contrast
Comparison: None.

CLINICAL DATA: Upper abdominal pain.  Hepatitis.

EXAM:
ULTRASOUND ABDOMEN LIMITED RIGHT UPPER QUADRANT

[Series 1: ultrasound abdomen limited · 0.16mm/px · 14 of 59 slices shown]
[im 1/59]
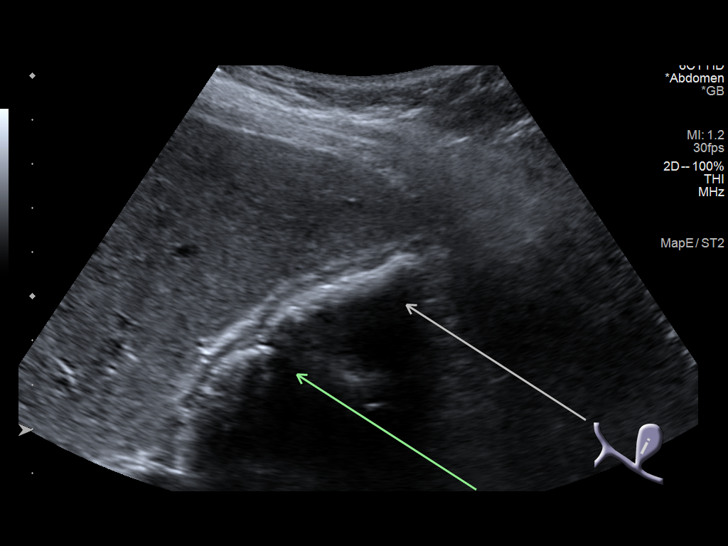
[im 5/59]
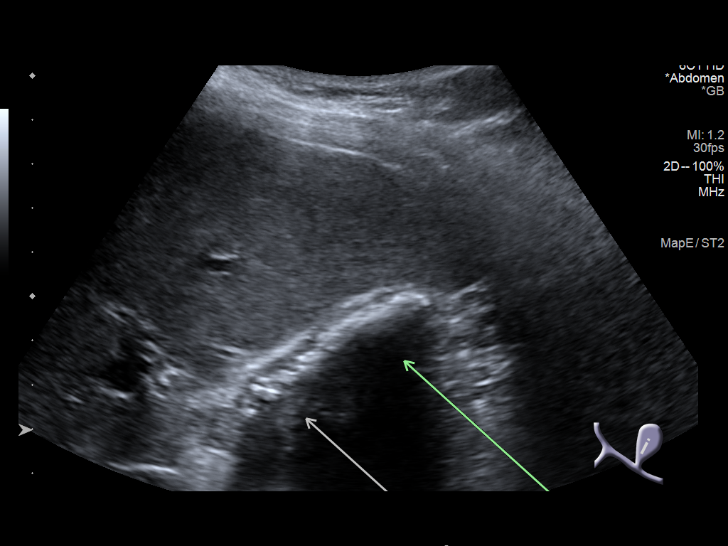
[im 10/59]
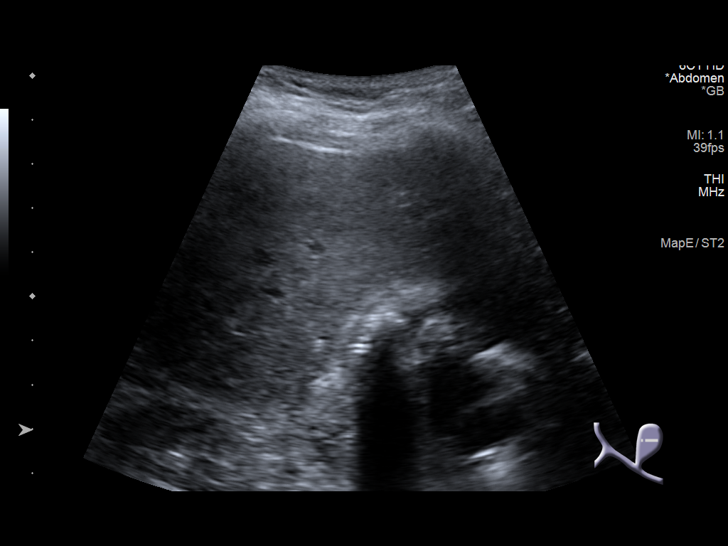
[im 15/59]
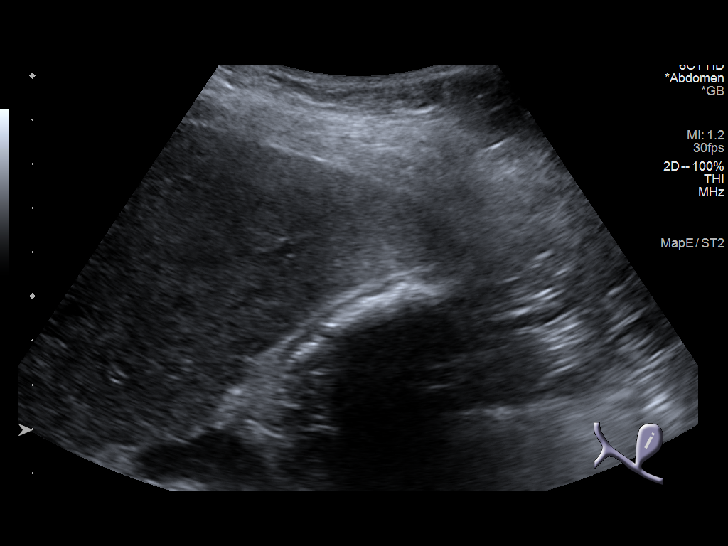
[im 20/59]
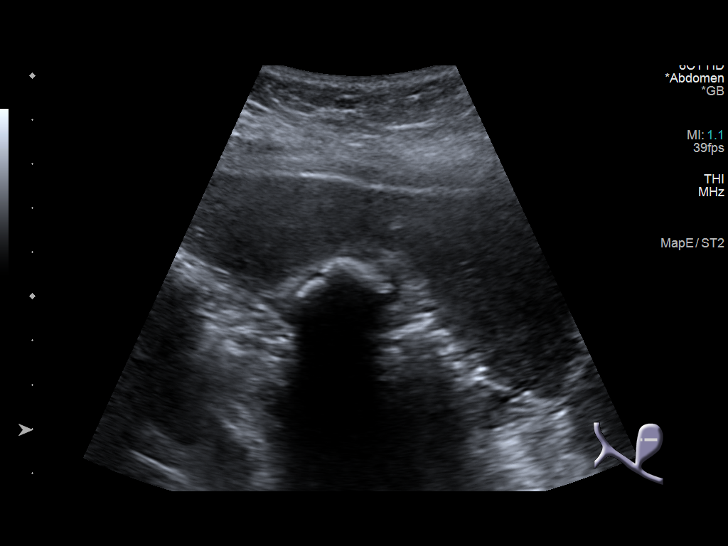
[im 22/59]
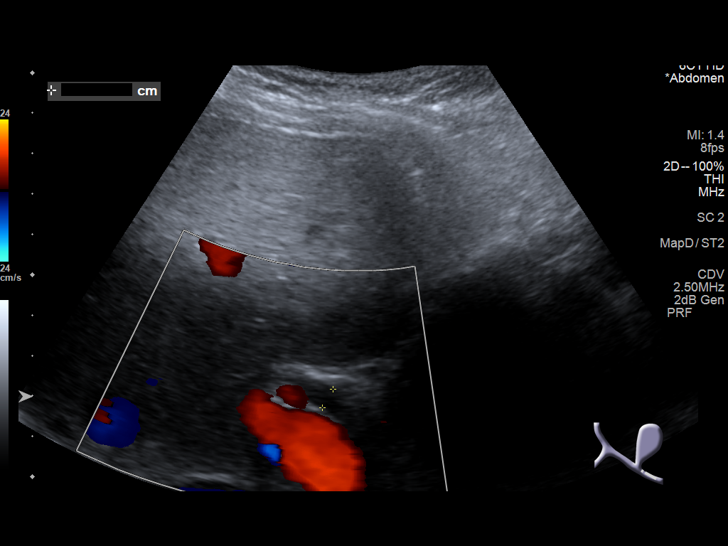
[im 27/59]
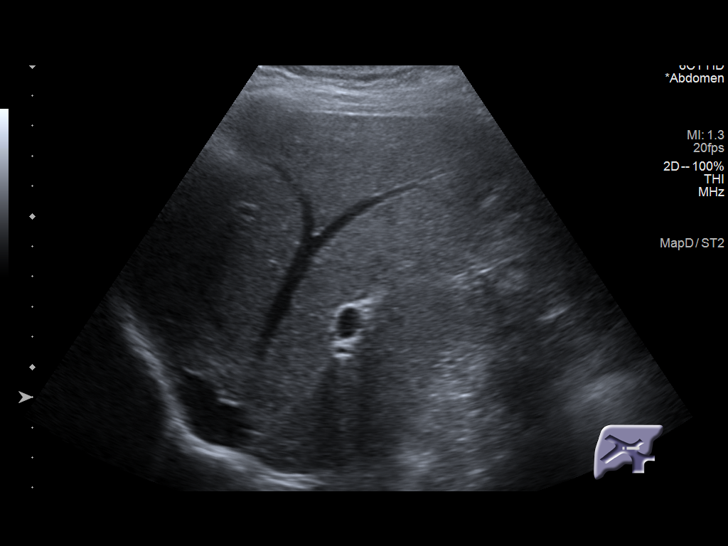
[im 32/59]
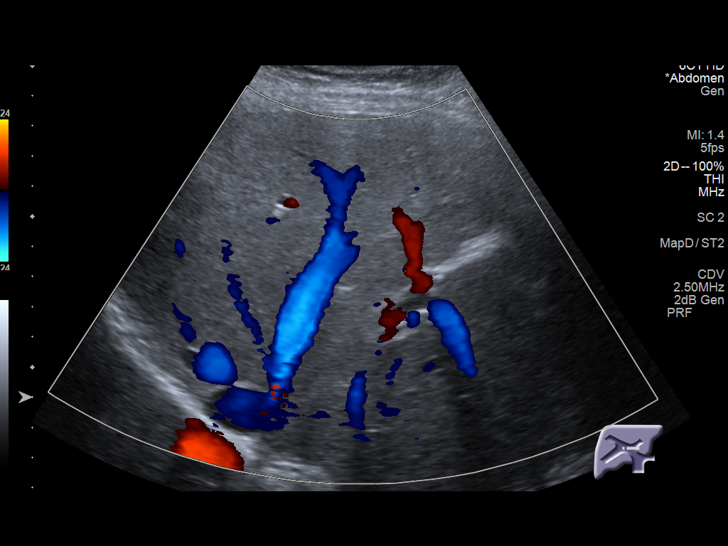
[im 37/59]
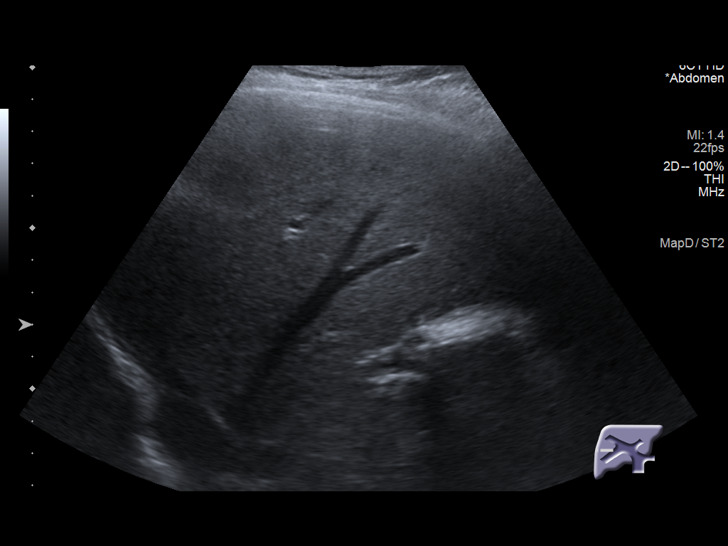
[im 39/59]
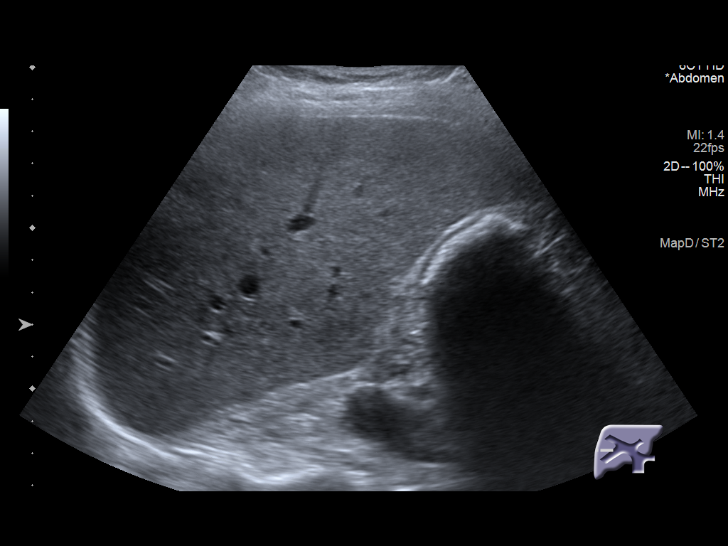
[im 44/59]
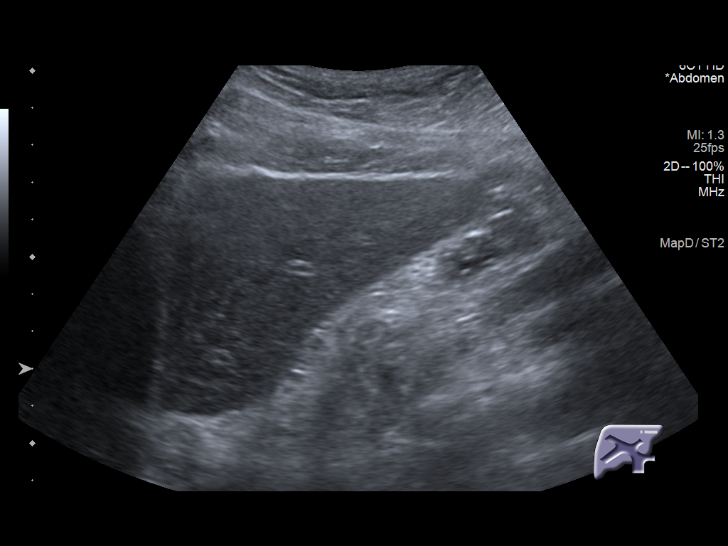
[im 49/59]
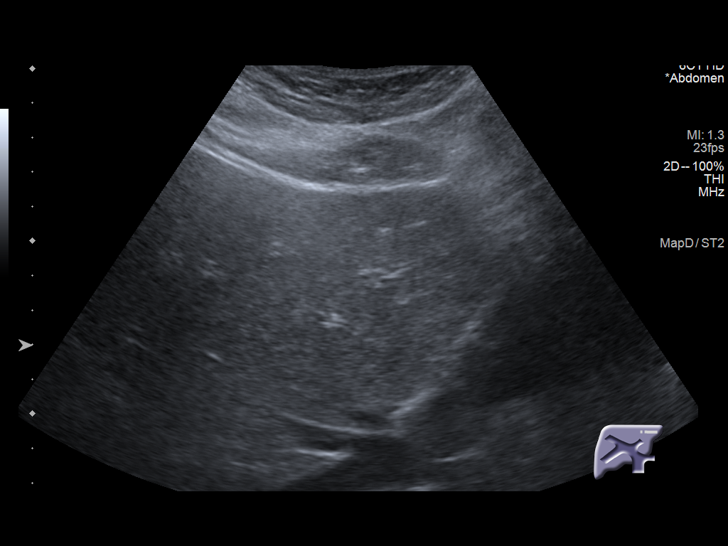
[im 54/59]
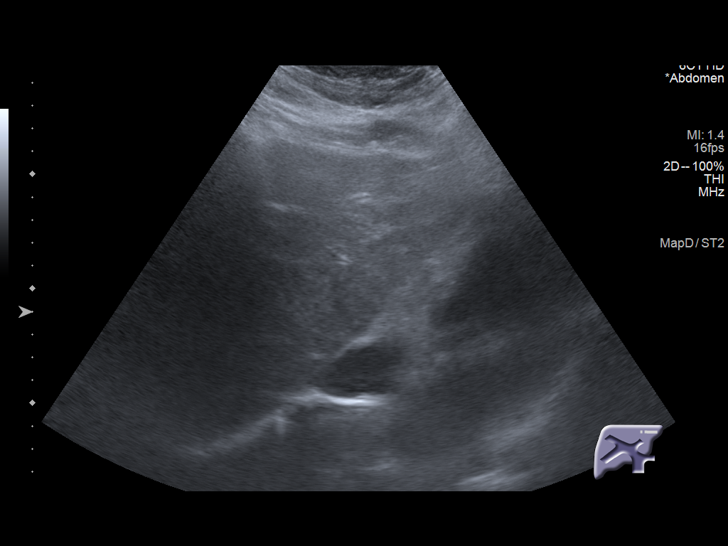
[im 59/59]
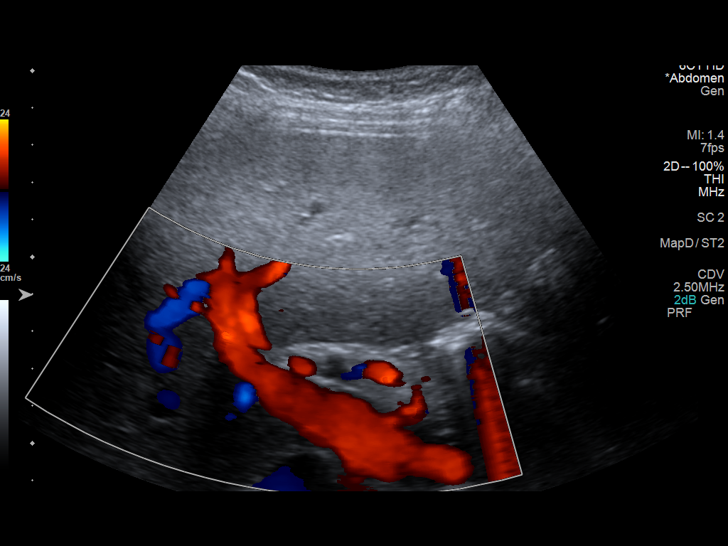

[14 of 25 positions shown; findings below may reference images not displayed]

FINDINGS: Gallbladder:

There are numerous gallstones creating shadowing just deep to the
gallbladder wall. Gallbladder wall is normal in thickness. No
sonographic Murphy's sign.

Common bile duct:

Diameter: 5 mm

Liver:

No focal lesion identified. Within normal limits in parenchymal
echogenicity. Portal vein is patent on color Doppler imaging with
normal direction of blood flow towards the liver.
IMPRESSION: 1. Numerous gallstones without evidence of acute cholecystitis.
2. No other abnormality.

## 2019-05-20 ENCOUNTER — Encounter: Payer: BC Managed Care – PPO | Admitting: Obstetrics and Gynecology
